# Patient Record
Sex: Male | Born: 2005 | Marital: Single | Smoking: Never smoker
Health system: Southern US, Community
[De-identification: ages and names within clinical notes are randomized; demographics above are authoritative.]

---
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
## 2023-09-26 DIAGNOSIS — K0889 Other specified disorders of teeth and supporting structures: Secondary | ICD-10-CM | POA: Diagnosis not present

## 2023-09-26 DIAGNOSIS — R059 Cough, unspecified: Secondary | ICD-10-CM | POA: Diagnosis not present

## 2023-09-26 DIAGNOSIS — R0600 Dyspnea, unspecified: Secondary | ICD-10-CM | POA: Diagnosis not present
